# Patient Record
Sex: Male | Born: 1960 | Race: White | Hispanic: No | Marital: Married | Smoking: Never smoker
Health system: Southern US, Community
[De-identification: ages and names within clinical notes are randomized; demographics above are authoritative.]

## PROBLEM LIST (undated history)

## (undated) DIAGNOSIS — I10 Essential (primary) hypertension: Secondary | ICD-10-CM

## (undated) DIAGNOSIS — E785 Hyperlipidemia, unspecified: Secondary | ICD-10-CM

## (undated) DIAGNOSIS — E119 Type 2 diabetes mellitus without complications: Secondary | ICD-10-CM

## (undated) HISTORY — DX: Type 2 diabetes mellitus without complications: E11.9

## (undated) HISTORY — DX: Hyperlipidemia, unspecified: E78.5

## (undated) HISTORY — DX: Essential (primary) hypertension: I10

---

## 1999-11-03 ENCOUNTER — Encounter: Payer: Self-pay | Admitting: Orthopedic Surgery

## 1999-11-03 ENCOUNTER — Ambulatory Visit (HOSPITAL_COMMUNITY): Admission: RE | Admit: 1999-11-03 | Discharge: 1999-11-03 | Payer: Self-pay | Admitting: Orthopedic Surgery

## 1999-11-17 ENCOUNTER — Ambulatory Visit (HOSPITAL_COMMUNITY): Admission: RE | Admit: 1999-11-17 | Discharge: 1999-11-17 | Payer: Self-pay | Admitting: Orthopedic Surgery

## 1999-11-17 ENCOUNTER — Encounter: Payer: Self-pay | Admitting: Orthopedic Surgery

## 2009-08-28 ENCOUNTER — Encounter: Admission: RE | Admit: 2009-08-28 | Discharge: 2009-08-28 | Payer: Self-pay | Admitting: Specialist

## 2009-08-31 ENCOUNTER — Encounter: Admission: RE | Admit: 2009-08-31 | Discharge: 2009-08-31 | Payer: Self-pay | Admitting: Specialist

## 2010-11-30 IMAGING — CT CT HEAD W/O CM
2 series · 16 of 30 positions shown, 20 images · non-contrast
Comparison: None.

CLINICAL DATA: Headache.  Memory loss.  Hypertension.

CT HEAD WITHOUT CONTRAST
TECHNIQUE: Contiguous axial images were obtained from the base of
the skull through the vertex without contrast.

[Series 2: head wo · axial · 0.47mm/px · z∈[-317,-180]mm · 13 of 32 slices shown, 17 images]
[im 3/32  brain]
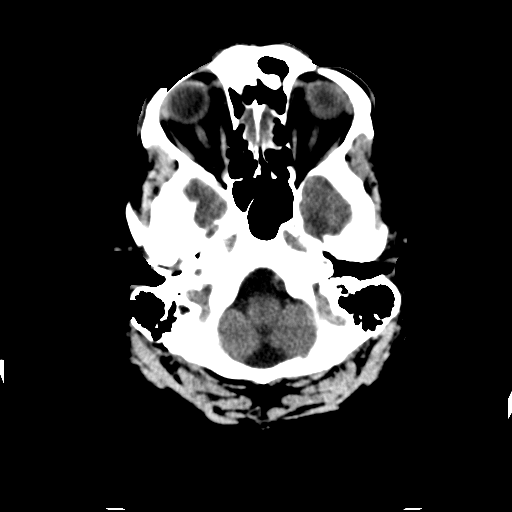
[im 3/32  bone]
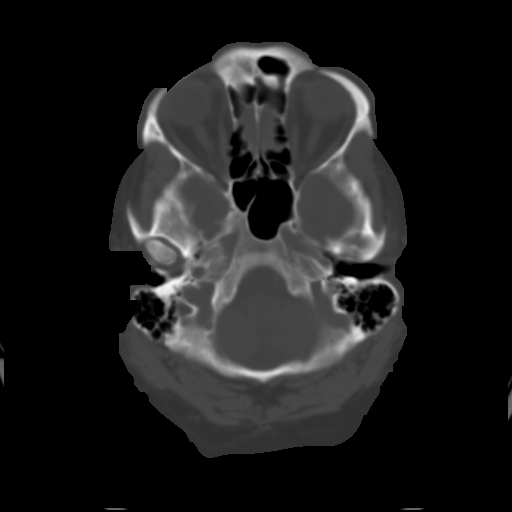
[im 5/32  brain]
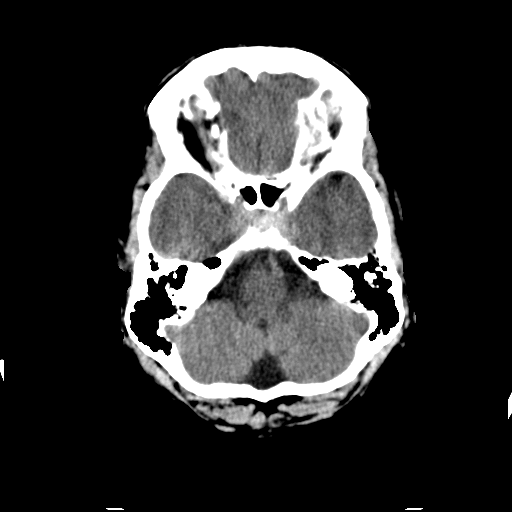
[im 7/32  brain]
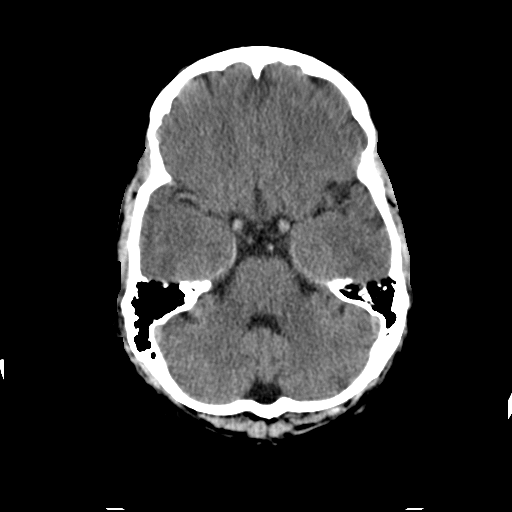
[im 9/32  brain]
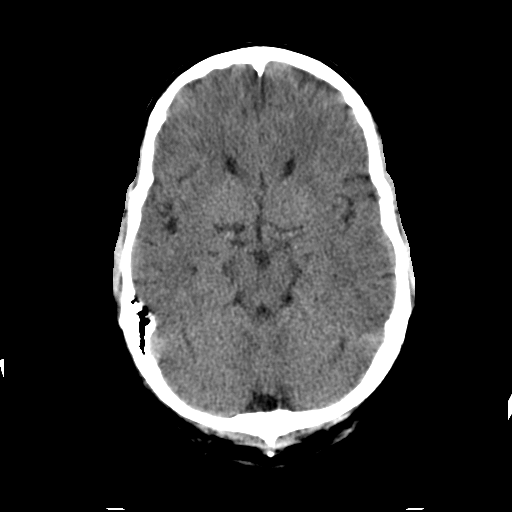
[im 12/32  brain]
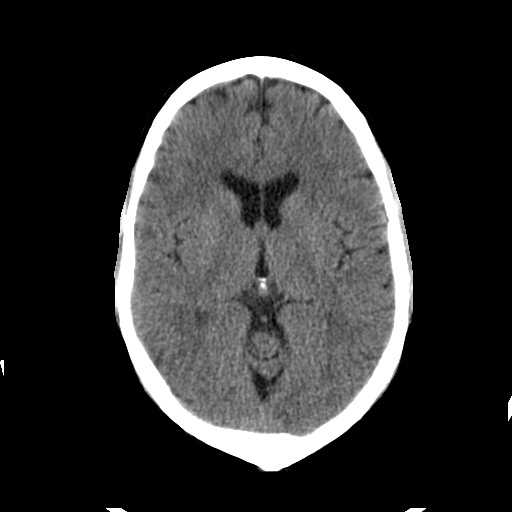
[im 12/32  bone]
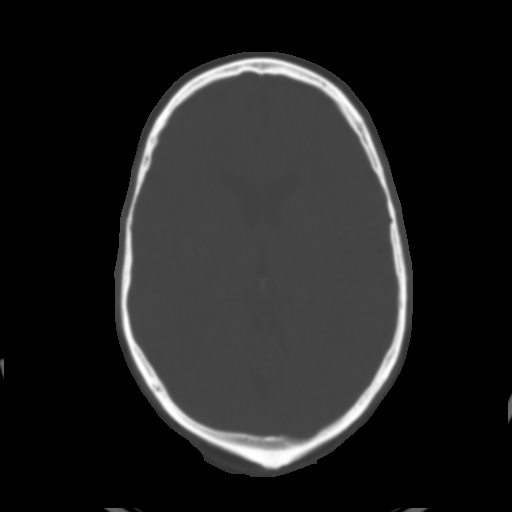
[im 14/32  brain]
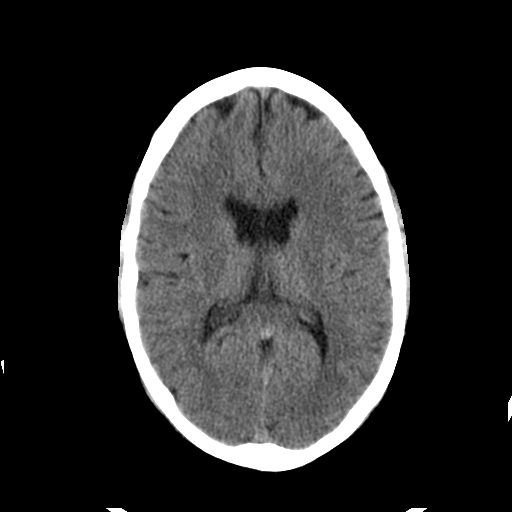
[im 16/32  brain]
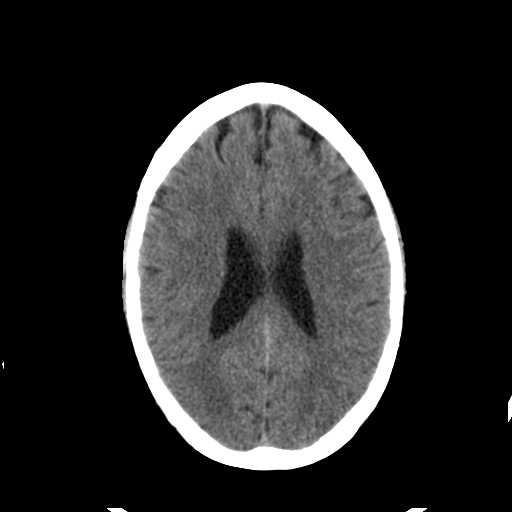
[im 18/32  brain]
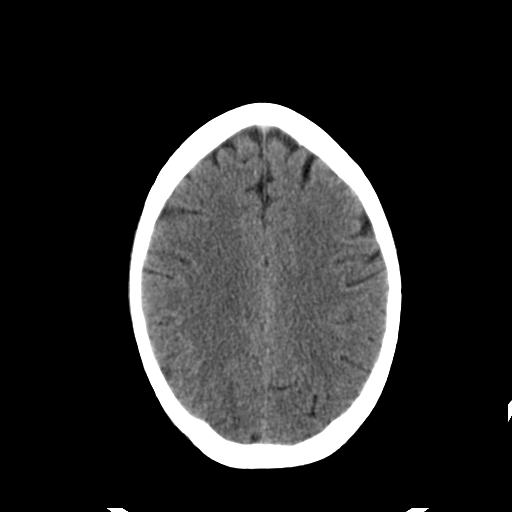
[im 20/32  brain]
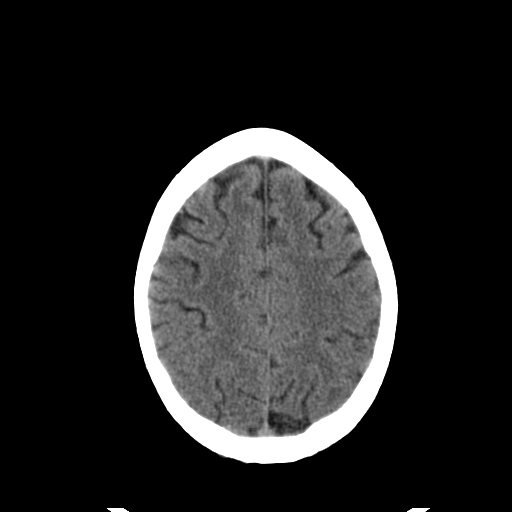
[im 20/32  bone]
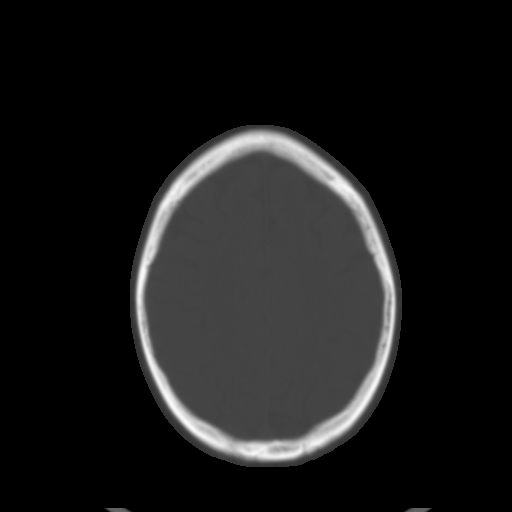
[im 23/32  brain]
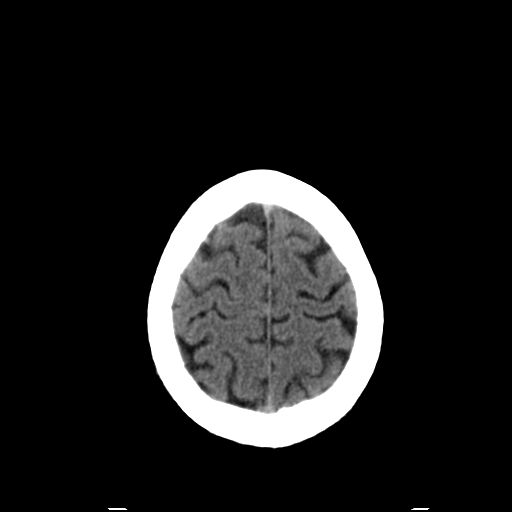
[im 25/32  brain]
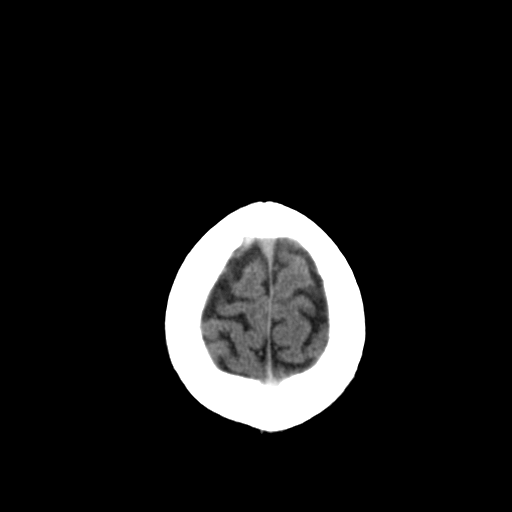
[im 27/32  brain]
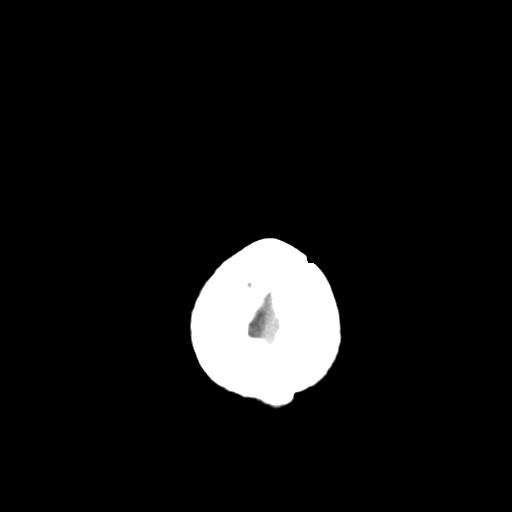
[im 29/32  brain]
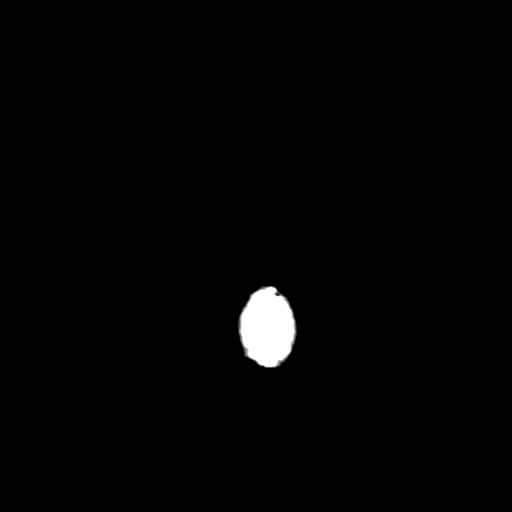
[im 29/32  bone]
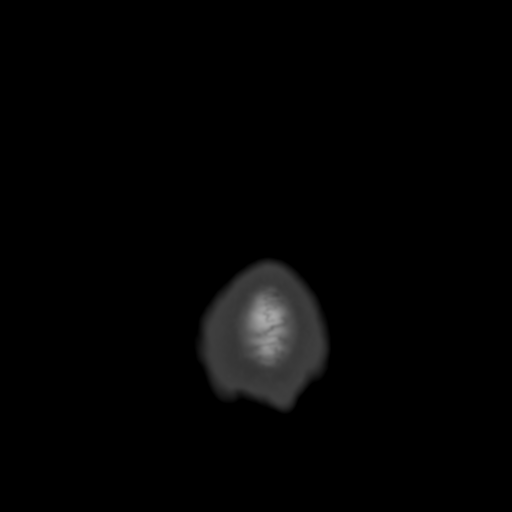

[Series 3: head bone · axial · 0.47mm/px · z∈[-317,-270]mm · 3 of 32 slices shown]
[im 3/32  bone]
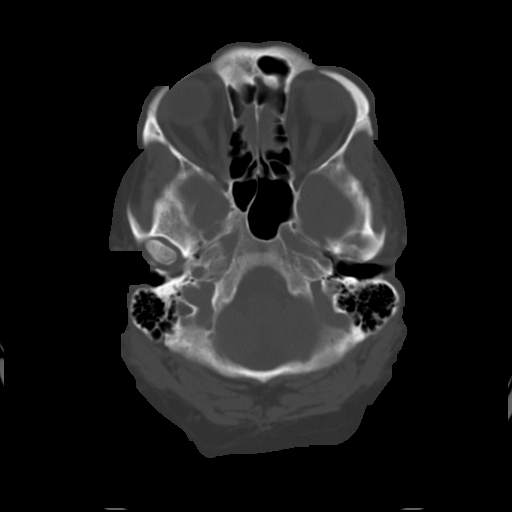
[im 7/32  bone]
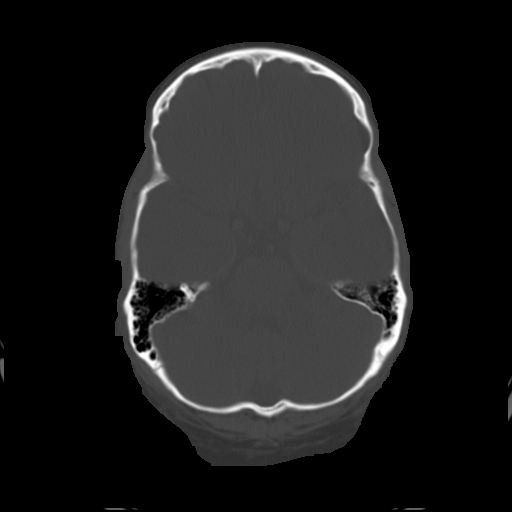
[im 12/32  bone]
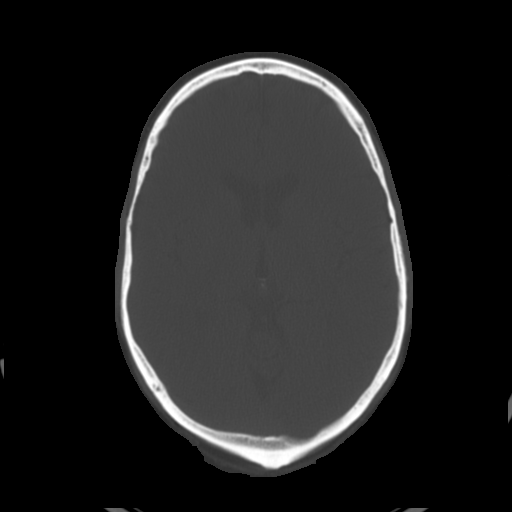

[16 of 30 positions shown; findings below may reference images not displayed]

FINDINGS: The examination was initially performed on 08/28/2009.
It was repeated today at no additional charge due to scanner
artifacts on the initial images.  The ventricles and subarachnoid
spaces are minimally prominent.  Minimal patchy white matter low
density in both cerebral hemispheres.  No intracranial hemorrhage,
mass lesion or CT evidence of acute infarction.  Unremarkable bones
and included paranasal sinuses.
IMPRESSION: Minimal atrophy and minimal chronic small vessel white matter
ischemic changes in both cerebral hemispheres.  No acute
abnormality.

## 2015-02-09 ENCOUNTER — Telehealth (HOSPITAL_COMMUNITY): Payer: Self-pay | Admitting: Cardiovascular Disease

## 2015-02-09 NOTE — Telephone Encounter (Signed)
Received [facility] for appointment with Dr Tresa EndoKelly on 04/05/15.  [facility] Dr Landry DykeKelly's schedule on 04/05/15.  lp

## 2015-04-05 ENCOUNTER — Encounter: Payer: Self-pay | Admitting: Cardiovascular Disease

## 2015-04-05 ENCOUNTER — Ambulatory Visit (INDEPENDENT_AMBULATORY_CARE_PROVIDER_SITE_OTHER): Payer: Self-pay | Admitting: Cardiovascular Disease

## 2015-04-05 VITALS — BP 144/96 | HR 71 | Ht 71.0 in | Wt 331.2 lb

## 2015-04-05 DIAGNOSIS — I10 Essential (primary) hypertension: Secondary | ICD-10-CM

## 2015-04-05 DIAGNOSIS — E119 Type 2 diabetes mellitus without complications: Secondary | ICD-10-CM

## 2015-04-05 DIAGNOSIS — R600 Localized edema: Secondary | ICD-10-CM

## 2015-04-05 MED ORDER — IRBESARTAN 150 MG PO TABS: 150.0000 mg | ORAL_TABLET | Freq: Every day | ORAL | Status: AC

## 2015-04-05 NOTE — Patient Instructions (Signed)
Your physician has recommended you make the following change in your medication: start new prescription for Irbesartan 150 mg daily. This has already been sent to your pharmacy.  Your physician recommends that you return for lab work fasting.  Your physician recommends that you schedule a follow-up appointment in: 2 months.

## 2015-04-08 ENCOUNTER — Encounter: Payer: Self-pay | Admitting: Cardiovascular Disease

## 2015-04-08 DIAGNOSIS — I10 Essential (primary) hypertension: Secondary | ICD-10-CM | POA: Insufficient documentation

## 2015-04-08 DIAGNOSIS — R600 Localized edema: Secondary | ICD-10-CM | POA: Insufficient documentation

## 2015-04-08 DIAGNOSIS — E119 Type 2 diabetes mellitus without complications: Secondary | ICD-10-CM | POA: Insufficient documentation

## 2015-04-08 NOTE — Progress Notes (Signed)
Patient ID: Derek Choi, male   DOB: [birth year], 54 y.o.   MRN: [id number]     PATIENT PROFILE: Derek Choi is a 54 y.o. male was today for cardiology evaluation.  He is referred through the courtesy of Dr. Herbert Moors for an abnormal ECG   HPI:  Derek Choi is a 54 y.o. male who has a history of hypertension, type 2 diabetes mellitus, and morbid obesity.  He has seen Dr. York Ram who follows him for his chronic medical problems.  He February, he developed episodes of vomiting and was evaluated at the [facility] emergency room.  He was told that his ECG is abnormal.  And presents for evaluation.  The patient in mitts to significant weight gain since he was married 18 years ago.  He has been working in the [employer].  He has gained over 100 pounds since he has been married; however, during the same period of time, his wife had gained over 250 pounds and weighs in excess of 500 pounds.  He has been taking amlodipine 10 mg daily and Maxzide for blood pressure control.  He is diabetic on metformin 1000 g twice a day and Amaryl 4 mg.  He is been taking baby aspirin.  He denies recent episodes of nausea or vomiting.  He presents for cardiac evaluation.  Of note, family history is notable that his father died at 71 and had an enlarged heart.  Past Medical History  Diagnosis Date  . Diabetes mellitus     type II  . Hypertension   . Hyperlipidemia     No past surgical history on file.  No Known Allergies  Current Outpatient Prescriptions  Medication Sig Dispense Refill  . amLODipine (NORVASC) 10 MG tablet Take 1 tablet by mouth daily.  6  . aspirin 81 MG chewable tablet Chew 81 mg by mouth daily.    Marland Kitchen glimepiride (AMARYL) 4 MG tablet Take 1 tablet by mouth daily.  6  . metFORMIN (GLUCOPHAGE) 1000 MG tablet Take 1 tablet by mouth 2 (two) times daily.  4  . triamterene-hydrochlorothiazide (MAXZIDE) 75-50 MG per tablet Take 1 tablet by mouth daily.  6  . irbesartan  (AVAPRO) 150 MG tablet Take 1 tablet (150 mg total) by mouth daily. 90 tablet 1   No current facility-administered medications for this visit.    Social history is notable in that he is married for 18 years.  He has 2 children, ages 57 and 67.  He previously worked in [employer] and recently works as a Secondary school teacher for [employer].  There is no history of tobacco or alcohol use.  He does not exercise.  Family History  Problem Relation Age of Onset  . Hyperthyroidism     Family history is notable that his mother is living at age 37.  Father died at age 4 with an enlarged heart.  He has one brother age 23 with thyroid issues and a sister age 67.  ROS General: Negative; No fevers, chills, or night sweats; positive for morbid obesity with greater than 100 pound weight gain since his marriage.   HEENT: Negative; No changes in vision or hearing, sinus congestion, difficulty swallowing Pulmonary: Negative; No cough, wheezing, shortness of breath, hemoptysis Cardiovascular:  See HPI; No chest pain, presyncope, syncope, palpitations, edema GI: Negative; No nausea, vomiting, diarrhea, or abdominal pain GU: Negative .  Presently, but recent nausea and vomiting in February. Musculoskeletal: Negative; no myalgias, joint pain, or  weakness Hematologic/Oncologic: Negative; no easy bruising, bleeding Endocrine: Negative; no heat/cold intolerance; no diabetes Neuro: Negative; no changes in balance, headaches Skin: Negative; No rashes or skin lesions Psychiatric: Negative; No behavioral problems, depression Sleep: Negative; No daytime sleepiness, hypersomnolence, bruxism, restless legs, hypnogagnic hallucinations Other comprehensive 14 point system review is negative   Physical Exam BP 144/96 mmHg  Pulse 71  Ht 5' 11"  (1.803 m)  Wt 331 lb 3.2 oz (150.231 kg)  BMI 46.21 kg/m2 General: Alert, oriented, no distress.  Skin: normal turgor, no rashes, warm and dry HEENT:  Normocephalic, atraumatic. Pupils equal round and reactive to light; sclera anicteric; extraocular muscles intact; Fundi mild increased vessel tortuosity.  No AV nicking Nose without nasal septal hypertrophy Mouth/Parynx benign; Mallinpatti scale 3 Neck: No JVD, no carotid bruits; normal carotid upstroke Lungs: clear to ausculatation and percussion; no wheezing or rales Chest wall: without tenderness to palpitation Heart: PMI not displaced, RRR, s1 s2 normal, 1/6 systolic murmur, no diastolic murmur, no rubs, gallops, thrills, or heaves Abdomen: Significant central adiposity; soft, nontender; no hepatosplenomehaly, BS+; abdominal aorta nontender and not dilated by palpation. Back: no CVA tenderness Pulses 2+ Musculoskeletal: full range of motion, normal strength, no joint deformities Extremities: Trace edema ; no clubbing cyanosis  Homan's sign negative  Neurologic: grossly nonfocal; Cranial nerves grossly wnl Psychologic: Normal mood and affect   ECG (independently read by me): Normal sinus rhythm at 71 bpm.  No ectopy.  QTc interval 428 ms.  Small Q wave in lead 3 which is not felt to be diagnostic.    LABS:  No flowsheet data found.   No flowsheet data found.   No flowsheet data found.   BNP No results found for: BNP  ProBNP No results found for: PROBNP   Lipid Panel  No results found for: CHOL, TRIG, HDL, CHOLHDL, VLDL, LDLCALC, LDLDIRECT    RADIOLOGY: No results found.   ASSESSMENT AND PLAN: Mr. [name] is a 54 year old gentleman who has at least a five-year history of diabetes mellitus and hypertension.  He is morbidly obese with a body mass index of 46.21 and has gained from 227 pounds to 331 pounds since his marriage.  His initial blood pressure today was 144/96 when taken by the nurse but on repeat by me was 178/94.  He has been taking amlodipine 10 mg daily and Maxzide for hypertension.  I am recommending initiation of irbesartan 150 mg daily for initiation  of ARB therapy.  A complete set of blood work will be obtained in the fasting state including a CBC, C met, lipid panel, TSH, and hemoglobin A1c.  I am scheduling him for an echo Doppler study to assess systolic and diastolic function, particularly with his family history of an enlarged heart.  We discussed the importance of proper diet as well as exercise and the importance of initiation of weight loss.  I will see him back in the office in 2 months for follow-up evaluation and further recommendations will be made at that time.    Troy Sine, MD, [facility] 04/08/2015 5:56 PM

## 2015-04-23 ENCOUNTER — Telehealth (HOSPITAL_COMMUNITY): Payer: Self-pay | Admitting: *Deleted

## 2015-04-27 ENCOUNTER — Other Ambulatory Visit (HOSPITAL_COMMUNITY): Payer: Self-pay

## 2015-06-09 ENCOUNTER — Ambulatory Visit: Payer: Self-pay | Admitting: Cardiovascular Disease

## 2019-10-08 ENCOUNTER — Other Ambulatory Visit: Payer: Self-pay

## 2019-10-08 DIAGNOSIS — Z20822 Contact with and (suspected) exposure to covid-19: Secondary | ICD-10-CM

## 2019-10-10 LAB — NOVEL CORONAVIRUS, NAA: SARS-CoV-2, NAA: NOT DETECTED

## 2023-01-26 DEATH — deceased
